# Patient Record
Sex: Male | Born: 1950 | Race: Black or African American | Hispanic: No | Marital: Married | Smoking: Former smoker
Health system: Southern US, Community
[De-identification: ages and names within clinical notes are randomized; demographics above are authoritative.]

## PROBLEM LIST (undated history)

## (undated) DIAGNOSIS — I10 Essential (primary) hypertension: Secondary | ICD-10-CM

---

## 1991-09-01 DIAGNOSIS — K429 Umbilical hernia without obstruction or gangrene: Secondary | ICD-10-CM | POA: Insufficient documentation

## 2002-09-16 DIAGNOSIS — K649 Unspecified hemorrhoids: Secondary | ICD-10-CM | POA: Insufficient documentation

## 2002-09-16 DIAGNOSIS — D126 Benign neoplasm of colon, unspecified: Secondary | ICD-10-CM | POA: Insufficient documentation

## 2009-12-05 ENCOUNTER — Emergency Department: Payer: Self-pay | Admitting: Emergency Medicine

## 2011-08-21 DIAGNOSIS — M1710 Unilateral primary osteoarthritis, unspecified knee: Secondary | ICD-10-CM | POA: Insufficient documentation

## 2011-08-21 DIAGNOSIS — N529 Male erectile dysfunction, unspecified: Secondary | ICD-10-CM | POA: Insufficient documentation

## 2012-09-19 ENCOUNTER — Emergency Department: Payer: Self-pay | Admitting: Emergency Medicine

## 2016-12-29 ENCOUNTER — Other Ambulatory Visit: Payer: Self-pay

## 2016-12-29 ENCOUNTER — Encounter: Payer: Self-pay | Admitting: Emergency Medicine

## 2016-12-29 ENCOUNTER — Emergency Department
Admission: EM | Admit: 2016-12-29 | Discharge: 2016-12-29 | Disposition: A | Discharge status: Home / Self Care | Payer: No Typology Code available for payment source | Attending: Emergency Medicine | Admitting: Emergency Medicine

## 2016-12-29 ENCOUNTER — Emergency Department: Payer: No Typology Code available for payment source

## 2016-12-29 DIAGNOSIS — Z87891 Personal history of nicotine dependence: Secondary | ICD-10-CM | POA: Insufficient documentation

## 2016-12-29 DIAGNOSIS — I10 Essential (primary) hypertension: Secondary | ICD-10-CM | POA: Diagnosis not present

## 2016-12-29 DIAGNOSIS — Y999 Unspecified external cause status: Secondary | ICD-10-CM | POA: Insufficient documentation

## 2016-12-29 DIAGNOSIS — S39012A Strain of muscle, fascia and tendon of lower back, initial encounter: Secondary | ICD-10-CM | POA: Insufficient documentation

## 2016-12-29 DIAGNOSIS — Y92410 Unspecified street and highway as the place of occurrence of the external cause: Secondary | ICD-10-CM | POA: Diagnosis not present

## 2016-12-29 DIAGNOSIS — S161XXA Strain of muscle, fascia and tendon at neck level, initial encounter: Secondary | ICD-10-CM | POA: Diagnosis not present

## 2016-12-29 DIAGNOSIS — T1490XA Injury, unspecified, initial encounter: Secondary | ICD-10-CM

## 2016-12-29 DIAGNOSIS — S199XXA Unspecified injury of neck, initial encounter: Secondary | ICD-10-CM | POA: Diagnosis present

## 2016-12-29 DIAGNOSIS — Y939 Activity, unspecified: Secondary | ICD-10-CM | POA: Insufficient documentation

## 2016-12-29 HISTORY — DX: Essential (primary) hypertension: I10

## 2016-12-29 MED ORDER — BACLOFEN 10 MG PO TABS: 10.0000 mg | ORAL_TABLET | Freq: Every day | ORAL | 1 refills | Status: AC

## 2016-12-29 MED ORDER — BACLOFEN 10 MG PO TABS
10.0000 mg | ORAL_TABLET | Freq: Three times a day (TID) | ORAL | Status: DC
  Administered 2016-12-29: 10 mg via ORAL
  Filled 2016-12-29: qty 1

## 2016-12-29 MED ORDER — MELOXICAM 7.5 MG PO TABS
15.0000 mg | ORAL_TABLET | Freq: Every day | ORAL | Status: DC
  Administered 2016-12-29: 15 mg via ORAL
  Filled 2016-12-29: qty 2

## 2016-12-29 MED ORDER — MELOXICAM 15 MG PO TABS: 15.0000 mg | ORAL_TABLET | Freq: Every day | ORAL | 2 refills | Status: AC
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
## 2016-12-29 NOTE — Discharge Instructions (Addendum)
Follow-up with Dr. Harlow Mares if you are not better in 5-7 days, for the first 3 days apply ice to any areas that hurt, after 3 days she can use wet heat followed by ice, please be active as if you sit around you will stiffen and become worse, use the medication as prescribed, return to the emergency department if your worsening

## 2017-01-04 ENCOUNTER — Other Ambulatory Visit: Payer: Self-pay | Admitting: Orthopedic Surgery

## 2017-01-04 DIAGNOSIS — M5412 Radiculopathy, cervical region: Secondary | ICD-10-CM

## 2017-01-08 ENCOUNTER — Ambulatory Visit
Admission: RE | Admit: 2017-01-08 | Discharge: 2017-01-08 | Disposition: A | Discharge status: Home / Self Care | Payer: No Typology Code available for payment source | Source: Ambulatory Visit | Attending: Orthopedic Surgery | Admitting: Orthopedic Surgery

## 2017-01-08 DIAGNOSIS — M4302 Spondylolysis, cervical region: Secondary | ICD-10-CM | POA: Diagnosis not present

## 2017-01-08 DIAGNOSIS — M5412 Radiculopathy, cervical region: Secondary | ICD-10-CM | POA: Insufficient documentation

## 2017-01-08 DIAGNOSIS — M4802 Spinal stenosis, cervical region: Secondary | ICD-10-CM | POA: Diagnosis not present

## 2017-01-08 DIAGNOSIS — M47892 Other spondylosis, cervical region: Secondary | ICD-10-CM | POA: Diagnosis not present

## 2020-02-15 ENCOUNTER — Other Ambulatory Visit: Payer: Self-pay | Admitting: Neurology

## 2020-02-15 DIAGNOSIS — G8929 Other chronic pain: Secondary | ICD-10-CM

## 2020-02-15 DIAGNOSIS — M21371 Foot drop, right foot: Secondary | ICD-10-CM

## 2020-02-15 DIAGNOSIS — M5441 Lumbago with sciatica, right side: Secondary | ICD-10-CM

## 2020-02-24 ENCOUNTER — Other Ambulatory Visit: Payer: Self-pay

## 2020-02-24 ENCOUNTER — Ambulatory Visit: Admission: RE | Admit: 2020-02-24 | Payer: Self-pay | Source: Ambulatory Visit

## 2020-02-24 ENCOUNTER — Ambulatory Visit
Admission: RE | Admit: 2020-02-24 | Discharge: 2020-02-24 | Disposition: A | Discharge status: Home / Self Care | Payer: No Typology Code available for payment source | Source: Ambulatory Visit | Attending: Neurology | Admitting: Neurology

## 2020-02-24 DIAGNOSIS — M21371 Foot drop, right foot: Secondary | ICD-10-CM | POA: Insufficient documentation

## 2020-02-24 DIAGNOSIS — G8929 Other chronic pain: Secondary | ICD-10-CM | POA: Insufficient documentation

## 2020-02-24 DIAGNOSIS — M5441 Lumbago with sciatica, right side: Secondary | ICD-10-CM | POA: Insufficient documentation

## 2021-02-14 ENCOUNTER — Other Ambulatory Visit: Payer: Self-pay

## 2021-02-14 ENCOUNTER — Ambulatory Visit (INDEPENDENT_AMBULATORY_CARE_PROVIDER_SITE_OTHER): Payer: No Typology Code available for payment source | Admitting: Podiatry

## 2021-02-14 DIAGNOSIS — Z23 Encounter for immunization: Secondary | ICD-10-CM | POA: Insufficient documentation

## 2021-02-14 DIAGNOSIS — M21371 Foot drop, right foot: Secondary | ICD-10-CM | POA: Insufficient documentation

## 2021-02-14 DIAGNOSIS — M2140 Flat foot [pes planus] (acquired), unspecified foot: Secondary | ICD-10-CM | POA: Insufficient documentation

## 2021-02-14 DIAGNOSIS — I10 Essential (primary) hypertension: Secondary | ICD-10-CM | POA: Insufficient documentation

## 2021-02-14 DIAGNOSIS — Z96659 Presence of unspecified artificial knee joint: Secondary | ICD-10-CM | POA: Insufficient documentation

## 2021-02-14 DIAGNOSIS — K027 Dental root caries: Secondary | ICD-10-CM | POA: Insufficient documentation

## 2021-02-14 DIAGNOSIS — M2010 Hallux valgus (acquired), unspecified foot: Secondary | ICD-10-CM | POA: Insufficient documentation

## 2021-02-14 DIAGNOSIS — M1990 Unspecified osteoarthritis, unspecified site: Secondary | ICD-10-CM | POA: Insufficient documentation

## 2021-02-14 DIAGNOSIS — M19072 Primary osteoarthritis, left ankle and foot: Secondary | ICD-10-CM | POA: Diagnosis not present

## 2021-02-14 DIAGNOSIS — L280 Lichen simplex chronicus: Secondary | ICD-10-CM | POA: Insufficient documentation

## 2021-02-14 DIAGNOSIS — I999 Unspecified disorder of circulatory system: Secondary | ICD-10-CM

## 2021-02-14 DIAGNOSIS — M7021 Olecranon bursitis, right elbow: Secondary | ICD-10-CM | POA: Insufficient documentation

## 2021-02-14 DIAGNOSIS — S8990XA Unspecified injury of unspecified lower leg, initial encounter: Secondary | ICD-10-CM | POA: Insufficient documentation

## 2021-02-14 DIAGNOSIS — K0263 Dental caries on smooth surface penetrating into pulp: Secondary | ICD-10-CM | POA: Insufficient documentation

## 2021-02-14 DIAGNOSIS — K219 Gastro-esophageal reflux disease without esophagitis: Secondary | ICD-10-CM | POA: Insufficient documentation

## 2021-02-14 DIAGNOSIS — M2012 Hallux valgus (acquired), left foot: Secondary | ICD-10-CM

## 2021-02-14 DIAGNOSIS — Z789 Other specified health status: Secondary | ICD-10-CM | POA: Insufficient documentation

## 2021-02-14 DIAGNOSIS — G4733 Obstructive sleep apnea (adult) (pediatric): Secondary | ICD-10-CM | POA: Insufficient documentation

## 2021-02-14 DIAGNOSIS — M21611 Bunion of right foot: Secondary | ICD-10-CM | POA: Insufficient documentation

## 2021-02-14 DIAGNOSIS — M5416 Radiculopathy, lumbar region: Secondary | ICD-10-CM | POA: Insufficient documentation

## 2021-02-14 DIAGNOSIS — E119 Type 2 diabetes mellitus without complications: Secondary | ICD-10-CM | POA: Insufficient documentation

## 2021-02-14 DIAGNOSIS — M545 Low back pain, unspecified: Secondary | ICD-10-CM | POA: Insufficient documentation

## 2021-02-14 DIAGNOSIS — M25579 Pain in unspecified ankle and joints of unspecified foot: Secondary | ICD-10-CM | POA: Insufficient documentation

## 2021-02-14 DIAGNOSIS — IMO0002 Reserved for concepts with insufficient information to code with codable children: Secondary | ICD-10-CM | POA: Insufficient documentation

## 2021-02-14 DIAGNOSIS — R002 Palpitations: Secondary | ICD-10-CM | POA: Insufficient documentation

## 2021-02-14 DIAGNOSIS — M109 Gout, unspecified: Secondary | ICD-10-CM | POA: Insufficient documentation

## 2021-02-14 DIAGNOSIS — M65311 Trigger thumb, right thumb: Secondary | ICD-10-CM | POA: Insufficient documentation

## 2021-02-14 DIAGNOSIS — M26622 Arthralgia of left temporomandibular joint: Secondary | ICD-10-CM | POA: Insufficient documentation

## 2021-02-14 DIAGNOSIS — S99929A Unspecified injury of unspecified foot, initial encounter: Secondary | ICD-10-CM | POA: Insufficient documentation

## 2021-02-14 DIAGNOSIS — D649 Anemia, unspecified: Secondary | ICD-10-CM | POA: Insufficient documentation

## 2021-02-14 DIAGNOSIS — F4310 Post-traumatic stress disorder, unspecified: Secondary | ICD-10-CM | POA: Insufficient documentation

## 2021-02-14 DIAGNOSIS — J309 Allergic rhinitis, unspecified: Secondary | ICD-10-CM | POA: Insufficient documentation

## 2021-02-14 DIAGNOSIS — M25541 Pain in joints of right hand: Secondary | ICD-10-CM | POA: Insufficient documentation

## 2021-02-14 DIAGNOSIS — M6530 Trigger finger, unspecified finger: Secondary | ICD-10-CM | POA: Insufficient documentation

## 2021-02-14 NOTE — Progress Notes (Signed)
Subjective:  Patient ID: Spencer Dominguez, male    DOB: 1950-03-02,  MRN: [id number]  Chief Complaint  Patient presents with   Bunions    Bunion on feet causing pain and discomfort     71 y.o. male presents with the above complaint.  Patient presents with bilateral bunion deformity left greater than right side.  Patient states is painful to touch painful to walk on.  He states he went to go see other foot doctors who did not want to do surgery has been causing him a lot of discomfort throbbing sensation.  He wanted get it evaluated.  He denies any other acute issues.  Pain scale is 7 out of 10 hurts with ambulation hurts with certain type of shoes.  He is part of the [place] system.  Review of Systems: Negative except as noted in the HPI. Denies N/V/F/Ch.  Past Medical History:  Diagnosis Date   Hypertension     Current Outpatient Medications:    allopurinol (ZYLOPRIM) 100 MG tablet, TAKE TWO TABLETS BY MOUTH EVERY DAY INCREASED DOSE FROM 100 TO 200MG  NOV 2022, Disp: , Rfl:    alprostadil (EDEX) 10 MCG injection, INJECT 10MCG/1ML VIAL INTRACAVERNOUSLY EVERY 96 HOURS (EVERY 4 DAYS) AS NEEDED INTO PENIS.SEEK MEDICAL ATTENTION FOR ERECTION LASTING MORE THAN 4 HOURS, Disp: , Rfl:    amLODipine (NORVASC) 10 MG tablet, TAKE ONE TABLET BY MOUTH ONCE EVERY DAY FOR BLOOD PRESSURE -- REPLACES LISINOPRIL, Disp: , Rfl:    Cholecalciferol 25 MCG (1000 UT) tablet, Take 1 tablet by mouth daily., Disp: , Rfl:    colchicine 0.6 MG tablet, TAKE ONE TABLET BY MOUTH TWO TIMES A DAY AS NEEDED JOINT PAIN, Disp: , Rfl:    ibuprofen (ADVIL) 800 MG tablet, TAKE ONE TABLET BY MOUTH TWO TIMES A DAY AS NEEDED FOR PAIN AND INFLAMMATION TAKE WITH FOOD, Disp: , Rfl:    losartan (COZAAR) 50 MG tablet, TAKE ONE TABLET BY MOUTH EVERY DAY FOR BLOOD PRESSURE INCREASE IN DOSE, Disp: , Rfl:    meloxicam (MOBIC) 15 MG tablet, TAKE ONE TABLET BY MOUTH ONCE DAILY AFTER A MEAL FOR JOINT DAMAGE TAKE WITH FOOD., Disp: , Rfl:    metFORMIN  (GLUCOPHAGE-XR) 500 MG 24 hr tablet, TAKE ONE-HALF TABLET BY MOUTH EVERY DAY FOR DIABETES, Disp: , Rfl:    omeprazole (PRILOSEC) 40 MG capsule, TAKE ONE CAPSULE BY MOUTH EVERY DAY TO CONTROL STOMACH ACID.  TAKE 30 MINUTES BEFORE A MEAL, Disp: , Rfl:    Oxymetazoline HCl (NASAL SPRAY) 0.05 % SOLN, SPRAY 1 SPRAY EACH NOSTRIL AS NEEDED, Disp: , Rfl:    predniSONE (STERAPRED UNI-PAK 21 TAB) 10 MG (21) TBPK tablet, 12 day taper pack, take as directed, Disp: , Rfl:    rosuvastatin (CRESTOR) 10 MG tablet, TAKE ONE-HALF TABLET BY MOUTH AT BEDTIME FOR CHOLESTEROL DO NOT TAKE WHEN USING THE COLCHICINE, Disp: , Rfl:    sertraline (ZOLOFT) 100 MG tablet, TAKE ONE TABLET BY MOUTH EVERY DAY FOR MOOD, Disp: , Rfl:    sildenafil (VIAGRA) 100 MG tablet, Take by mouth., Disp: , Rfl:    traZODone (DESYREL) 100 MG tablet, TAKE ONE TABLET BY MOUTH AT BEDTIME FOR SLEEP, Disp: , Rfl:    triamcinolone cream (KENALOG) 0.1 %, APPLY SMALL AMOUNT TOPICALLY TWO TIMES A DAY AS NEEDED FOR SKIN RASH, Disp: , Rfl:    hydrochlorothiazide (MICROZIDE) 12.5 MG capsule, Take by mouth., Disp: , Rfl:   Social History   Tobacco Use  Smoking Status Former  Smokeless  Tobacco Not on file    Allergies  Allergen Reactions   Lisinopril     Other reaction(s): Unknown   Morphine Itching   Sildenafil     Other reaction(s): Other (See Comments)   Objective:  There were no vitals filed for this visit. There is no height or weight on file to calculate BMI. Constitutional Well developed. Well nourished.  Vascular Dorsalis pedis pulses non palpable bilaterally. Posterior tibial pulses non palpable bilaterally. Capillary refill normal to all digits.  No cyanosis or clubbing noted. Pedal hair growth normal.  Neurologic Normal speech. Oriented to person, place, and time. Epicritic sensation to light touch grossly present bilaterally.  Dermatologic Nails well groomed and normal in appearance. No open wounds. No skin lesions.   Orthopedic: Normal joint ROM without pain or crepitus bilaterally. Hallux abductovalgus deformity present bilaterally left greater than right side.  Limited range of motion noted at the first metatarsophalangeal joint.  Crepitus noted first MPJ joint.  Severe bunion deformity noted driving the underlying arthritis Left 1st MPJ diminished range of motion. Left 1st TMT without gross hypermobility. Right 1st MPJ diminished range of motion  Right 1st TMT without gross hypermobility. Lesser digital contractures absent bilaterally.   Radiographs: We will plan on x-ray prior to surgery Assessment:   1. Vascular abnormality   2. 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  Hallux abductovalgus deformity,  -XR as above. -Patient has failed all conservative therapy and wishes to proceed with surgical intervention. All risks, benefits, and alternatives discussed with patient. No guarantees given. Consent reviewed and signed by patient. Post-op course explained at length. -Planned procedures: First metatarsophalangeal joint fusion -Risk factors: Diabetes and possible peripheral vascular disease -I discussed with the patient in extensive detail that I will be happy to address the bunion deformity but primarily rule out peripheral vascular disease and I discussed with the patient to keep his sugars under control with last A1c of [place] year ago at 6.7. -I will repeat a new A1c prior to surgery as well. -We will plan on doing the left side first followed by the right side if it continues to bother him.  Patient states understanding -ABIs PVRs were ordered.  No follow-ups on file.

## 2021-03-08 ENCOUNTER — Other Ambulatory Visit: Payer: Self-pay | Admitting: Podiatry

## 2021-03-08 DIAGNOSIS — I739 Peripheral vascular disease, unspecified: Secondary | ICD-10-CM

## 2021-03-09 ENCOUNTER — Ambulatory Visit (INDEPENDENT_AMBULATORY_CARE_PROVIDER_SITE_OTHER): Payer: No Typology Code available for payment source

## 2021-03-09 ENCOUNTER — Other Ambulatory Visit: Payer: Self-pay

## 2021-03-09 DIAGNOSIS — I739 Peripheral vascular disease, unspecified: Secondary | ICD-10-CM

## 2021-03-09 DIAGNOSIS — I999 Unspecified disorder of circulatory system: Secondary | ICD-10-CM

## 2021-03-14 ENCOUNTER — Other Ambulatory Visit: Payer: Self-pay

## 2021-03-14 ENCOUNTER — Ambulatory Visit (INDEPENDENT_AMBULATORY_CARE_PROVIDER_SITE_OTHER): Payer: No Typology Code available for payment source | Admitting: Podiatry

## 2021-03-14 DIAGNOSIS — M2012 Hallux valgus (acquired), left foot: Secondary | ICD-10-CM | POA: Diagnosis not present

## 2021-03-14 DIAGNOSIS — M19072 Primary osteoarthritis, left ankle and foot: Secondary | ICD-10-CM | POA: Diagnosis not present

## 2021-03-14 DIAGNOSIS — I739 Peripheral vascular disease, unspecified: Secondary | ICD-10-CM

## 2021-03-14 DIAGNOSIS — E119 Type 2 diabetes mellitus without complications: Secondary | ICD-10-CM | POA: Diagnosis not present

## 2021-03-14 DIAGNOSIS — Z794 Long term (current) use of insulin: Secondary | ICD-10-CM

## 2021-03-14 NOTE — Progress Notes (Signed)
?Subjective:  ?Patient ID: Spencer Dominguez, male    DOB: [birth year],  MRN: [id number] ? ?Chief Complaint  ?Patient presents with  ? Bunions  ? ? ?71 y.o. male presents with the above complaint.  Patient presents with bilateral bunion deformity with left greater than right side.  Patient states is still painful.  He had the ABIs PVRs done.  He states that he was trying to schedule a vascular surgery appointment but [facility]  He denies any other acute complaints ? ?Review of Systems: Negative except as noted in the HPI. Denies N/V/F/Ch. ? ?Past Medical History:  ?Diagnosis Date  ? Hypertension   ? ? ?Current Outpatient Medications:  ?  allopurinol (ZYLOPRIM) 100 MG tablet, TAKE TWO TABLETS BY MOUTH EVERY DAY INCREASED DOSE FROM 100 TO '200MG'$  NOV 2022, Disp: , Rfl:  ?  alprostadil (EDEX) 10 MCG injection, INJECT 10MCG/1ML VIAL INTRACAVERNOUSLY EVERY 96 HOURS (EVERY 4 DAYS) AS NEEDED INTO PENIS.SEEK MEDICAL ATTENTION FOR ERECTION LASTING MORE THAN 4 HOURS, Disp: , Rfl:  ?  amLODipine (NORVASC) 10 MG tablet, TAKE ONE TABLET BY MOUTH ONCE EVERY DAY FOR BLOOD PRESSURE -- REPLACES LISINOPRIL, Disp: , Rfl:  ?  Cholecalciferol 25 MCG (1000 UT) tablet, Take 1 tablet by mouth daily., Disp: , Rfl:  ?  colchicine 0.6 MG tablet, TAKE ONE TABLET BY MOUTH TWO TIMES A DAY AS NEEDED JOINT PAIN, Disp: , Rfl:  ?  hydrochlorothiazide (MICROZIDE) 12.5 MG capsule, Take by mouth., Disp: , Rfl:  ?  ibuprofen (ADVIL) 800 MG tablet, TAKE ONE TABLET BY MOUTH TWO TIMES A DAY AS NEEDED FOR PAIN AND INFLAMMATION TAKE WITH FOOD, Disp: , Rfl:  ?  losartan (COZAAR) 50 MG tablet, TAKE ONE TABLET BY MOUTH EVERY DAY FOR BLOOD PRESSURE INCREASE IN DOSE, Disp: , Rfl:  ?  meloxicam (MOBIC) 15 MG tablet, TAKE ONE TABLET BY MOUTH ONCE DAILY AFTER A MEAL FOR JOINT DAMAGE TAKE WITH FOOD., Disp: , Rfl:  ?  metFORMIN (GLUCOPHAGE-XR) 500 MG 24 hr tablet, TAKE ONE-HALF TABLET BY MOUTH EVERY DAY FOR DIABETES, Disp: , Rfl:  ?   omeprazole (PRILOSEC) 40 MG capsule, TAKE ONE CAPSULE BY MOUTH EVERY DAY TO CONTROL STOMACH ACID.  TAKE 30 MINUTES BEFORE A MEAL, Disp: , Rfl:  ?  Oxymetazoline HCl (NASAL SPRAY) 0.05 % SOLN, SPRAY 1 SPRAY EACH NOSTRIL AS NEEDED, Disp: , Rfl:  ?  predniSONE (STERAPRED UNI-PAK 21 TAB) 10 MG (21) TBPK tablet, 12 day taper pack, take as directed, Disp: , Rfl:  ?  rosuvastatin (CRESTOR) 10 MG tablet, TAKE ONE-HALF TABLET BY MOUTH AT BEDTIME FOR CHOLESTEROL DO NOT TAKE WHEN USING THE COLCHICINE, Disp: , Rfl:  ?  sertraline (ZOLOFT) 100 MG tablet, TAKE ONE TABLET BY MOUTH EVERY DAY FOR MOOD, Disp: , Rfl:  ?  sildenafil (VIAGRA) 100 MG tablet, Take by mouth., Disp: , Rfl:  ?  traZODone (DESYREL) 100 MG tablet, TAKE ONE TABLET BY MOUTH AT BEDTIME FOR SLEEP, Disp: , Rfl:  ?  triamcinolone cream (KENALOG) 0.1 %, APPLY SMALL AMOUNT TOPICALLY TWO TIMES A DAY AS NEEDED FOR SKIN RASH, Disp: , Rfl:  ? ?Social History  ? ?Tobacco Use  ?Smoking Status Former  ?Smokeless Tobacco Not on file  ? ? ?Allergies  ?Allergen Reactions  ? Lisinopril   ?  Other reaction(s): Unknown  ? Morphine Itching  ? Sildenafil   ?  Other reaction(s): Other (See Comments)  ? ?Objective:  ?There were no  vitals filed for this visit. ?There is no height or weight on file to calculate BMI. ?Constitutional Well developed. ?Well nourished.  ?Vascular Dorsalis pedis pulses non palpable bilaterally. ?Posterior tibial pulses non palpable bilaterally. ?Capillary refill normal to all digits.  ?No cyanosis or clubbing noted. ?Pedal hair growth normal.  ?Neurologic Normal speech. ?Oriented to person, place, and time. ?Epicritic sensation to light touch grossly present bilaterally.  ?Dermatologic Nails well groomed and normal in appearance. ?No open wounds. ?No skin lesions.  ?Orthopedic: Normal joint ROM without pain or crepitus bilaterally. ?Hallux abductovalgus deformity present bilaterally left greater than right side.  Limited range of motion noted at the first  metatarsophalangeal joint.  Crepitus noted first MPJ joint.  Severe bunion deformity noted driving the underlying arthritis ?Left 1st MPJ diminished range of motion. ?Left 1st TMT without gross hypermobility. ?Right 1st MPJ diminished range of motion  ?Right 1st TMT without gross hypermobility. ?Lesser digital contractures absent bilaterally.  ? ?Radiographs: ?We will plan on x-ray prior to surgery ?Assessment:  ? ?1. Peripheral vascular disease ([place])   ?2. 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 Type 2 diabetes mellitus without complication, with long-term current use of insulin ([place])   ? ? ?Plan:  ?Patient was evaluated and treated and all questions answered. ? ?Hallux abductovalgus deformity, left greater than right ?-XR as above. ?-Patient has failed all conservative therapy and wishes to proceed with surgical intervention. All risks, benefits, and alternatives discussed with patient. No guarantees given. Consent reviewed and signed by patient. Post-op course explained at length. ?-Planned procedures: First metatarsophalangeal joint fusion ?-Risk factors: Diabetes and possible peripheral vascular disease ?-I discussed with the patient in extensive detail that I will be happy to address the bunion deformity but primarily rule out peripheral vascular disease and I discussed with the patient to keep his sugars under control with last A1c of Els[place] year ago at 6.7. ?-I will repeat a new A1c prior to surgery as well. ?-We will plan on doing the left side first followed by the right side if it continues to bother him.  Patient states understanding ?-ABIs PVRs were reviewed with the patient which shows peripheral vascular disease.  Patient is scheduled to see a cardiology group with the [place].  I discussed with him that if he is unable to see the cardiology group there we can send him to a civilian group to help with the circulation but he will need a  referral from the [place].  He states understanding. ? ?No follow-ups on file. ? ?

## 2021-03-17 ENCOUNTER — Ambulatory Visit: Payer: No Typology Code available for payment source | Admitting: Cardiovascular Disease

## 2021-04-14 ENCOUNTER — Ambulatory Visit: Payer: No Typology Code available for payment source | Admitting: Vascular Surgery

## 2021-05-12 ENCOUNTER — Other Ambulatory Visit: Payer: Self-pay | Admitting: Podiatry

## 2021-05-12 DIAGNOSIS — I739 Peripheral vascular disease, unspecified: Secondary | ICD-10-CM

## 2021-07-18 IMAGING — MR MR LUMBAR SPINE W/O CM
4 of 5 series · 29 of 48 positions shown · non-contrast
Comparison: Radiographs of the lumbar spine 09/19/2012. Report from
radiographs of the lumbar spine 02/10/2020 (images unavailable).

CLINICAL DATA: Chronic bilateral low back pain with right-sided
sciatica. Right foot drop. Additional history provided by scanning
technologist: Patient reports low back pain with pain in right leg
to foot with tingling, ongoing for 1 year.

EXAM:
MRI LUMBAR SPINE WITHOUT CONTRAST
TECHNIQUE: Multiplanar, multisequence MR imaging of the lumbar spine was
performed. No intravenous contrast was administered.

[Series 5: T2 · sagittal · 4.0mm · 0.81mm/px · 6 of 17 slices shown (1 of 2)]
[im 1/17]
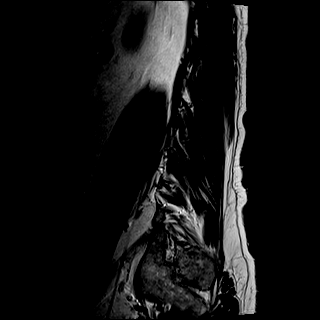
[im 4/17]
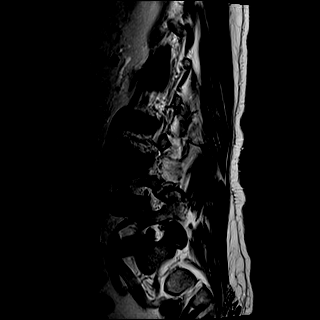
[im 7/17]
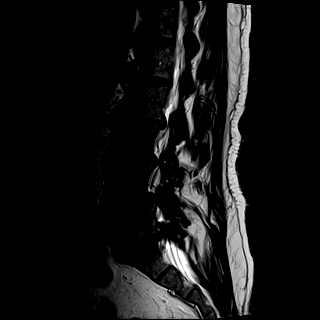
[im 10/17]
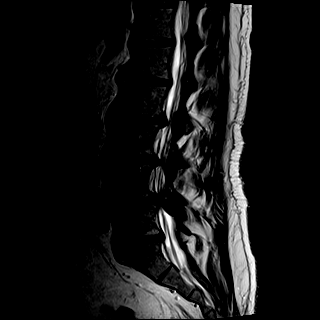
[im 13/17]
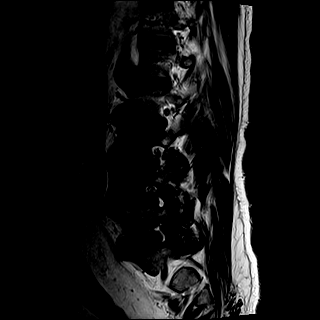
[im 17/17]
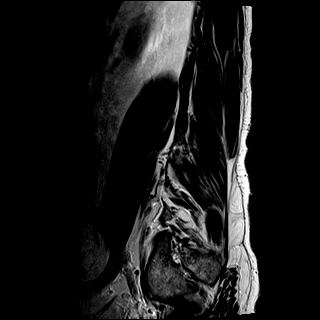

[Series 6: T1 · sagittal · 4.0mm · 0.81mm/px · 7 of 17 slices shown (1 of 2)]
[im 1/17]
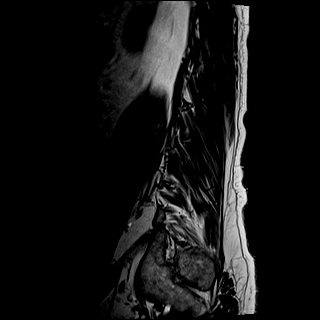
[im 3/17]
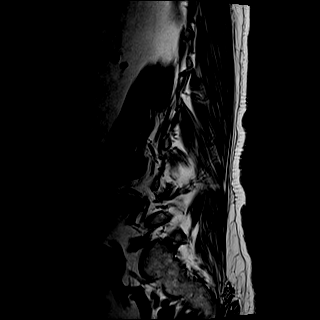
[im 6/17]
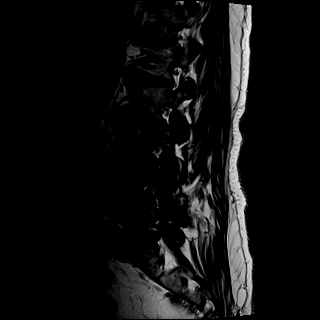
[im 9/17]
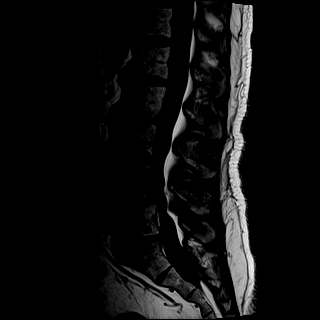
[im 11/17]
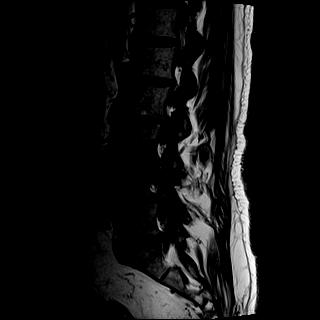
[im 14/17]
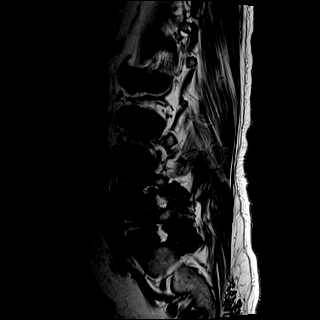
[im 17/17]
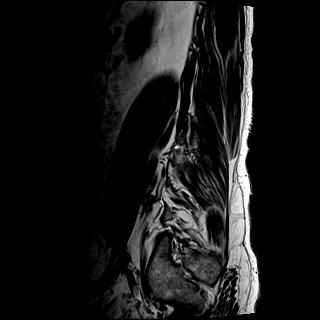

[Series 8: T2 · axial · 4.0mm · 0.78mm/px · z∈[-63,+140]mm · 8 of 37 slices shown (2 of 2)]
[im 1/37]
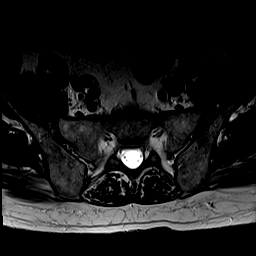
[im 6/37]
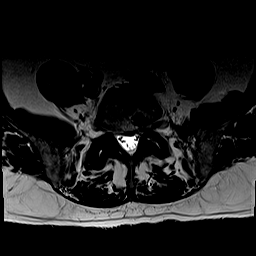
[im 12/37]
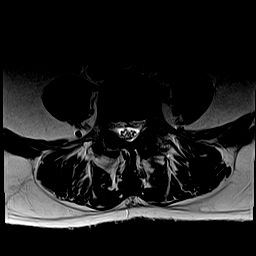
[im 17/37]
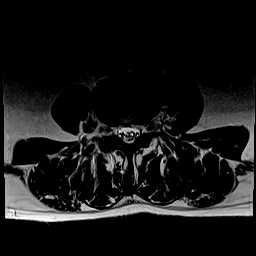
[im 20/37]
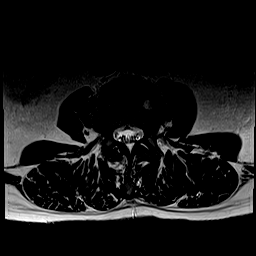
[im 25/37]
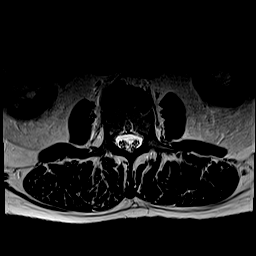
[im 31/37]
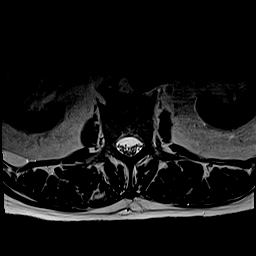
[im 37/37]
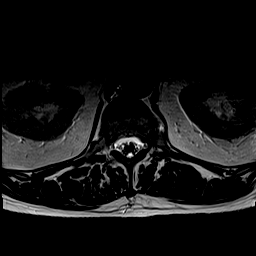

[Series 9: T1 · axial · 4.0mm · 0.39mm/px · z∈[-63,+140]mm · 8 of 37 slices shown (2 of 2)]
[im 1/37]
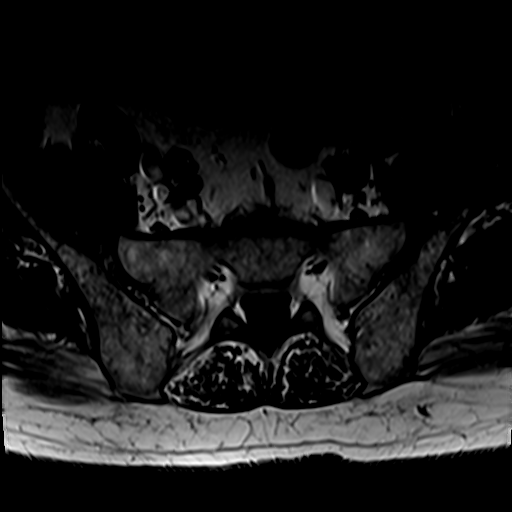
[im 6/37]
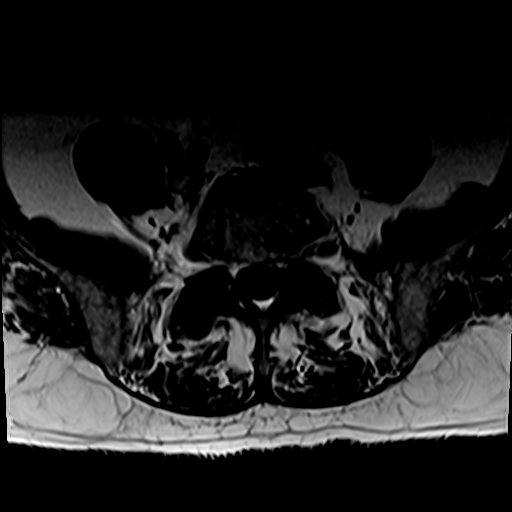
[im 12/37]
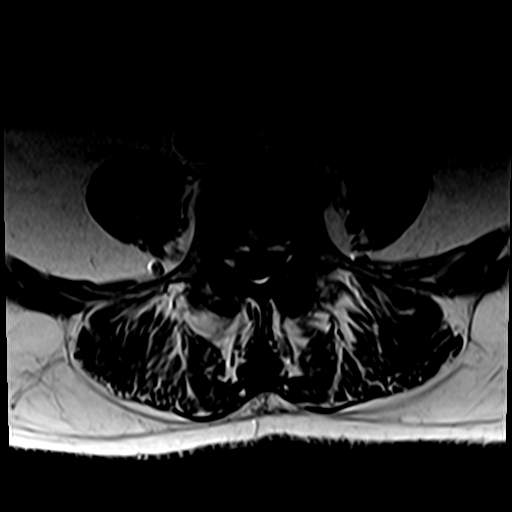
[im 17/37]
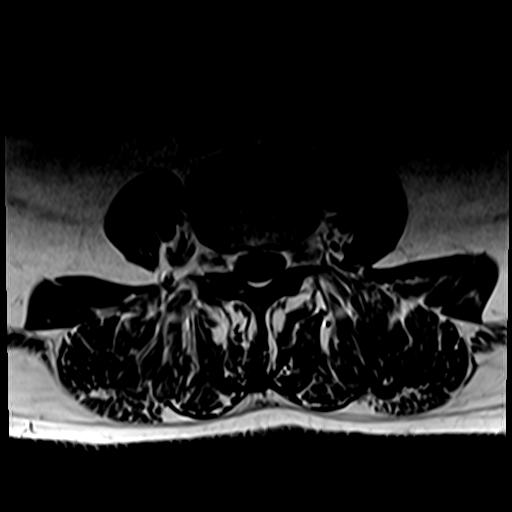
[im 20/37]
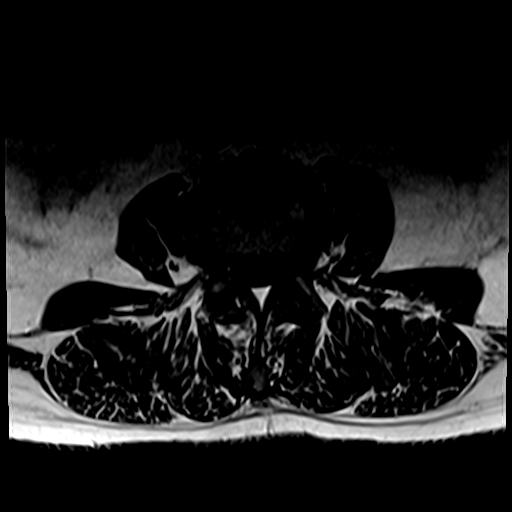
[im 25/37]
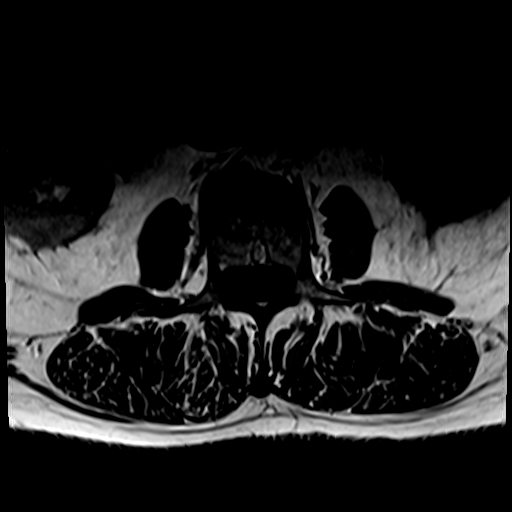
[im 31/37]
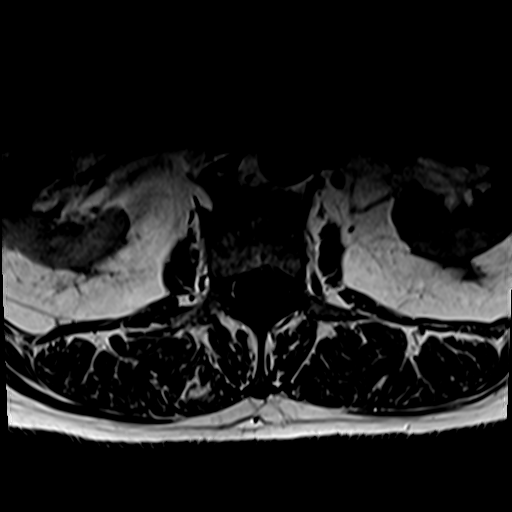
[im 37/37]
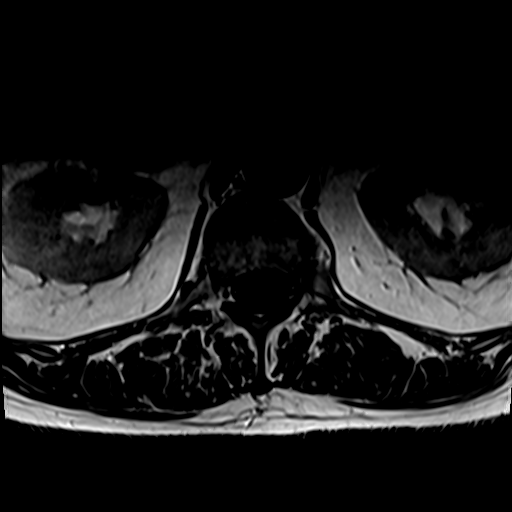

[29 of 48 positions shown; findings below may reference images not displayed]

FINDINGS: Segmentation:  5 lumbar vertebrae.

Alignment:  6 mm L4-L5 grade 1 anterolisthesis.

Vertebrae: Redemonstrated mild chronic anterior wedging of the L1
vertebral body. Vertebral body height is otherwise maintained. Mild
multilevel degenerative endplate irregularity. Trace degenerative
endplate edema at L2-L3, L3-L4 and L5-S1. Multilevel ventrolateral
osteophytes.

Conus medullaris and cauda equina: Conus extends to the L1 level. No
signal abnormality within the visualized distal spinal cord.

Paraspinal and other soft tissues: Small right renal cyst. Atrophy
of the lumbar paraspinal musculature.

Disc levels:

Moderate disc degeneration throughout the lumbar spine, greatest at
L2-L3, L4-L5 and L5-S1.

T12-L1: Facet arthrosis. No significant disc herniation or stenosis.

L1-L2: Disc bulge with mild endplate spurring. Mild facet arthrosis.
No significant spinal canal stenosis. Mild right neural foraminal
narrowing.

L2-L3: Disc bulge with endplate spurring. Mild prominence of the
dorsal epidural fat. Mild to moderate bilateral subarticular and
central canal narrowing. Bilateral neural foraminal narrowing
(mild/moderate right, mild left).

L3-L4: Disc bulge with endplate spurring. Prominence of the dorsal
epidural fat. Mild-to-moderate facet arthrosis with ligamentum
flavum hypertrophy. Severe bilateral subarticular narrowing with
encroachment upon the bilateral descending L4 nerve roots (series 8,
image 23). Severe central canal stenosis. Mild bilateral neural
foraminal narrowing.

L4-L5: Grade 1 anterolisthesis. Disc uncovering with disc bulge.
Superimposed small central disc extrusion with mild caudal migration
(series 5, image 9). Additional superimposed small right foraminal
disc protrusion (series 6, image 5). Advanced facet arthrosis with
ligamentum flavum hypertrophy. 5 mm ventrally projecting synovial
facet cyst on the right. Trace left facet joint effusion. Severe
bilateral subarticular and central canal stenosis. The right
foraminal disc protrusion contributes to moderate right neural
foraminal narrowing and contacts the undersurface of the exiting
right L4 nerve root (series 6, image 5). Mild/moderate left neural
foraminal narrowing.

L5-S1: Disc bulge with endplate spurring. Moderate facet arthrosis.
Mild bilateral subarticular narrowing without appreciable nerve root
impingement. Central canal patent. Moderate bilateral neural
foraminal narrowing (greater on the right).
IMPRESSION: Lumbar spondylosis as outlined with findings most notably as
follows.

At L4-L5, there is multifactorial severe bilateral subarticular and
central canal stenosis. Mild/moderate bilateral foraminal narrowing.
Advanced facet arthrosis with 6 mm grade 1 anterolisthesis at this
level.

At L3-L4, there is multifactorial severe bilateral subarticular
stenosis with encroachment upon the bilateral descending L4 nerve
roots. Severe central canal stenosis. Mild bilateral neural
foraminal narrowing.

At L2-L3, there is multifactorial mild/moderate bilateral
subarticular and central canal narrowing. Bilateral neural foraminal
narrowing (mild/moderate right, mild left).

No more than mild relative spinal canal narrowing at the remaining
levels. Additional sites of neural foraminal narrowing as described
and greatest bilaterally at L5-S1 (moderate at this level).
